# Patient Record
Sex: Male | Born: 1985 | Race: Black or African American | Hispanic: No | Marital: Single | Smoking: Never smoker
Health system: Southern US, Community
[De-identification: ages and names within clinical notes are randomized; demographics above are authoritative.]

## PROBLEM LIST (undated history)

## (undated) DIAGNOSIS — J45909 Unspecified asthma, uncomplicated: Secondary | ICD-10-CM

## (undated) DIAGNOSIS — J302 Other seasonal allergic rhinitis: Secondary | ICD-10-CM

---
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
## 2013-11-06 ENCOUNTER — Encounter (HOSPITAL_COMMUNITY): Payer: Self-pay | Admitting: Emergency Medicine

## 2013-11-06 ENCOUNTER — Emergency Department (HOSPITAL_COMMUNITY)
Admission: EM | Admit: 2013-11-06 | Discharge: 2013-11-06 | Disposition: A | Discharge status: Home / Self Care | Payer: Federal, State, Local not specified - PPO | Attending: Emergency Medicine | Admitting: Emergency Medicine

## 2013-11-06 DIAGNOSIS — J45909 Unspecified asthma, uncomplicated: Secondary | ICD-10-CM | POA: Insufficient documentation

## 2013-11-06 DIAGNOSIS — Y929 Unspecified place or not applicable: Secondary | ICD-10-CM | POA: Insufficient documentation

## 2013-11-06 DIAGNOSIS — S0100XA Unspecified open wound of scalp, initial encounter: Secondary | ICD-10-CM | POA: Insufficient documentation

## 2013-11-06 DIAGNOSIS — S0101XA Laceration without foreign body of scalp, initial encounter: Secondary | ICD-10-CM

## 2013-11-06 DIAGNOSIS — Z23 Encounter for immunization: Secondary | ICD-10-CM | POA: Insufficient documentation

## 2013-11-06 DIAGNOSIS — W010XXA Fall on same level from slipping, tripping and stumbling without subsequent striking against object, initial encounter: Secondary | ICD-10-CM | POA: Insufficient documentation

## 2013-11-06 DIAGNOSIS — J309 Allergic rhinitis, unspecified: Secondary | ICD-10-CM | POA: Insufficient documentation

## 2013-11-06 DIAGNOSIS — Y9301 Activity, walking, marching and hiking: Secondary | ICD-10-CM | POA: Insufficient documentation

## 2013-11-06 DIAGNOSIS — IMO0002 Reserved for concepts with insufficient information to code with codable children: Secondary | ICD-10-CM | POA: Insufficient documentation

## 2013-11-06 HISTORY — DX: Unspecified asthma, uncomplicated: J45.909

## 2013-11-06 HISTORY — DX: Other seasonal allergic rhinitis: J30.2

## 2013-11-06 MED ORDER — TETANUS-DIPHTHERIA TOXOIDS TD 5-2 LFU IM INJ
0.5000 mL | INJECTION | Freq: Once | INTRAMUSCULAR | Status: AC
  Administered 2013-11-06: 0.5 mL via INTRAMUSCULAR
  Filled 2013-11-06: qty 0.5

## 2013-11-06 MED ORDER — BACITRACIN ZINC 500 UNIT/GM EX OINT: 1.0000 "application " | TOPICAL_OINTMENT | Freq: Two times a day (BID) | CUTANEOUS | Status: AC

## 2013-11-06 NOTE — ED Notes (Signed)
Patient presents to ED via [facility]. Patient admits to ETOH. Pt was walking in living room "tripped" and hit the right side of his head on a piece of furniture. Pt unsure if he had a LOC. Laceration noted to the right side of patients head, bleeding controlled at this time. Pt is A&OX4. Ambulatory from EMS truck. VSS. No acute distress noted at this time.

## 2013-11-06 NOTE — ED Provider Notes (Signed)
CSN: [id number]     Arrival date & time 11/06/13  0109 History   First MD Initiated Contact with Patient 11/06/13 0133     Chief Complaint  Patient presents with  . Head Laceration   (Consider location/radiation/quality/duration/timing/severity/associated sxs/prior Treatment) HPI This patient is a 28 year old man who presents with laceration of the right scalp. Patient was intoxicated, fell and struck head against a dresser. He did not lose consciousness.  He has mild burning pain at the site of the injury. No significant headache. No nausea, vomiting or  neurologic symptoms.last tetanus is unknown.  Past Medical History  Diagnosis Date  . Asthma   . Seasonal allergies    History reviewed. No pertinent past surgical history. No family history on file. History  Substance Use Topics  . Smoking status: Never Smoker   . Smokeless tobacco: Not on file  . Alcohol Use: Yes    Review of Systems Ten point review of symptoms performed and is negative with the exception of symptoms noted above.   Allergies  Review of patient's allergies indicates no known allergies.  Home Medications   Current Outpatient Rx  Name  Route  Sig  Dispense  Refill  . albuterol (PROVENTIL HFA;VENTOLIN HFA) 108 (90 BASE) MCG/ACT inhaler   Inhalation   Inhale 1-2 puffs into the lungs every 6 (six) hours as needed for wheezing or shortness of breath.          BP 118/88  Pulse 101  Temp(Src) 98 F (36.7 C) (Oral)  Resp 16  Ht 5\' 11"  (1.803 m)  Wt 250 lb (113.399 kg)  BMI 34.88 kg/m2  SpO2 94% Physical Exam Gen: well developed and well nourished appearing Head: 11cm curvilinear laceration to the right parietal scalp Eyes: PERL, EOMI Nose: no epistaixis or rhinorrhea Mouth/throat: mucosa is moist and pink Neck: no c spine ttp Lungs: CTA B, no wheezing, rhonchi or rales CV: RRR, no murmur, extremities appear well perfused.  Abd: soft, notender, nondistended Back: no ttp, no cva ttp Skin: warm and  dry Ext: normal to inspection, no ttp, FROM Neuro: CN ii-xii grossly intact, no focal deficits Psyche; normal affect,  calm and cooperative.   ED Course  Procedures (including critical care time)  LACERATION REPAIR Performed by: Brandt LoosenManly, Julie Authorized by: Brandt LoosenManly, Julie Consent: Verbal consent obtained. Risks and benefits: risks, benefits and alternatives were discussed Consent given by: patient Patient identity confirmed: provided demographic data Prepped and Draped in normal sterile fashion Wound explored  Laceration Location: right frontoparietal scalp  Laceration Length: 11 cm  No Foreign Bodies seen or palpated  Anesthesia: local infiltration  Local anesthetic: lidocaine 1% with epinephrine  Anesthetic total: 3 ml  Irrigation method: syringe Amount of cleaning: standard  Skin closure: with staples  Number of staples: 11    Patient tolerance: Patient tolerated the procedure well with no immediate complications.  MDM  Scalp laceration repaired in ED. Patient stable for d/c with counsel re: wound care and referral to UC for staple removal.     Brandt LoosenJulie Manly, MD 11/06/13 0800

## 2013-11-15 ENCOUNTER — Emergency Department (HOSPITAL_COMMUNITY)
Admission: EM | Admit: 2013-11-15 | Discharge: 2013-11-15 | Disposition: A | Discharge status: Home / Self Care | Payer: Federal, State, Local not specified - PPO | Source: Home / Self Care | Attending: Emergency Medicine | Admitting: Emergency Medicine

## 2013-11-15 ENCOUNTER — Encounter (HOSPITAL_COMMUNITY): Payer: Self-pay | Admitting: Emergency Medicine

## 2013-11-15 DIAGNOSIS — Z4802 Encounter for removal of sutures: Secondary | ICD-10-CM

## 2013-11-15 DIAGNOSIS — S0101XA Laceration without foreign body of scalp, initial encounter: Secondary | ICD-10-CM

## 2013-11-15 NOTE — ED Provider Notes (Signed)
  Chief Complaint   Chief Complaint  Patient presents with  . Suture / Staple Removal    History of Present Illness   Miguel Gray is a 28 year old male who injured his head on his dresser at home on February 6. The patient thinks he might of been a brief loss of consciousness, however there is no mention of this in the emergency room note. The laceration was closed with staples and he returns today for staple removal. The wound is been healing well with no evidence of infection. He denies any headaches, blurry vision, diplopia, or neurological symptoms.    Review of Systems   Other than as noted above, the patient denies any of the following symptoms: Eye:  No eye pain, diplopia or blurred vision ENT:  No bleeding from nose or ears.  No loose or broken teeth. Neck:  No pain or limited ROM. GI:  No nausea or vomiting. Neuro:  No loss of consciousness, seizure activity, numbness, tingling, or weakness.  PMFSH   Past medical history, family history, social history, meds, and allergies were reviewed.    Physical Examination   Vital signs:  BP 124/78  Pulse 79  Temp(Src) 97.7 F (36.5 C) (Oral)  Resp 16  SpO2 100% General:  Alert and oriented times 3.  In no distress. Eye:  PERRL, full EOMs.  Lids and conjunctivas normal. HEENT:  There is a healing laceration in the right frontal area that has been closed by staples. There is no evidence of infection.  TMs and canals normal, nasal mucosa normal.  No oral lacerations.  Teeth were intact without obvious oral trauma. Neck:  Non tender.  Full ROM without pain. Neurological:  Alert and oriented.  Cranial nerves intact.  No pronator drift.  No muscle weakness.   Procedure   Verbal informed consent was obtained.  The patient was informed of the risks and benefits of the procedure and understands and accepts.  A time out was called and the identitiy of the patient and correct procedure were verified. The wound was prepped with  alcohol and all staples were removed. It appears to be healing well without any evidence of infection.  Ass and esessment   The encounter diagnosis was Scalp laceration.  Well healed with no evidence of infection.  Plan   1.  Meds:  The following meds were prescribed:   Discharge Medication List as of 11/15/2013  2:56 PM      2.  Patient Education/Counseling:  The patient was given appropriate handouts, self care instructions, and instructed in symptomatic relief. Instructions were given for wound care.    3.  Follow up:  The patient was told to follow up immediately if there is any sign of infection.   Reuben Likesavid C Nadir Vasques, MD 11/15/13 [phone]

## 2013-11-15 NOTE — Discharge Instructions (Signed)
Wash with shampoo and water.  No need to apply antibiotic ointment.

## 2013-11-15 NOTE — ED Notes (Signed)
Pt here for staple removal.  Wound appears well healed no signs of infection. Pt voices no concerns at this time.
